# Patient Record
Sex: Female | Born: 2000 | Race: Black or African American | Hispanic: No | Marital: Single | Smoking: Never smoker
Health system: Southern US, Community
[De-identification: ages and names within clinical notes are randomized; demographics above are authoritative.]

## PROBLEM LIST (undated history)

## (undated) ENCOUNTER — Ambulatory Visit: Payer: Self-pay

---
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
## 2022-05-08 ENCOUNTER — Ambulatory Visit: Admit: 2022-05-08 | Payer: Self-pay

## 2022-05-08 ENCOUNTER — Ambulatory Visit
Admission: EM | Admit: 2022-05-08 | Discharge: 2022-05-08 | Disposition: A | Discharge status: Home / Self Care | Payer: Self-pay | Attending: Emergency Medicine | Admitting: Emergency Medicine

## 2022-05-08 DIAGNOSIS — J020 Streptococcal pharyngitis: Secondary | ICD-10-CM

## 2022-05-08 LAB — POCT RAPID STREP A (OFFICE): Rapid Strep A Screen: NEGATIVE

## 2022-05-08 MED ORDER — LIDOCAINE VISCOUS HCL 2 % MT SOLN: 15.0000 mL | OROMUCOSAL | 0 refills | Status: DC | PRN

## 2022-05-08 MED ORDER — CEFDINIR 300 MG PO CAPS: 300.0000 mg | ORAL_CAPSULE | Freq: Two times a day (BID) | ORAL | 0 refills | Status: AC

## 2022-05-08 MED ORDER — IBUPROFEN 400 MG PO TABS: 400.0000 mg | ORAL_TABLET | Freq: Three times a day (TID) | ORAL | 0 refills | Status: DC | PRN

## 2022-05-08 NOTE — ED Triage Notes (Signed)
Pt c/o sore throat, cough and sneeze that developed today. 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   Home interventions: none

## 2022-05-08 NOTE — Discharge Instructions (Signed)
Your strep test today is positive.  I recommend that you begin antibiotics now for treatment.  I have sent a prescription to your pharmacy.  Please take them as prescribed.  You will begin to feel better in about 24 hours.  Please be sure that you you finish the entire 10-day course of treatment to avoid worsening infection that may require longer treatment with stronger antibiotics.   After 24 hours of taking antibiotics, you are no longer contagious.  Please discard your toothbrush as well as any other oral devices that you are currently using and replace them with new ones to avoid reinfection.   Please read below to learn more about the medications, dosages and frequencies that I recommend to help alleviate your symptoms and to get you feeling better soon:  Omnicef (cefdinir):  1 capsule twice daily for 10 days, you can take it with or without food.  This antibiotic can cause upset stomach, this will resolve once antibiotics are complete.  You are welcome to use a probiotic, eat yogurt, take Imodium while taking this medication.  Please avoid other systemic medications such as Maalox, Pepto-Bismol or milk of magnesia as they can interfere with your body's ability to absorb the antibiotics.  Advil, Motrin (ibuprofen): This is a good anti-inflammatory medication which addresses aches, pains and inflammation of the upper airways that causes sinus and nasal congestion as well as in the lower airways which makes your cough feel tight and sometimes burn.  I recommend that you take between 400 to 600 mg every 6-8 hours as needed.      Xylocaine (lidocaine): This is a numbing medication that can be swished for 15 seconds and swallowed.  You can use this every 3 hours while awake to relieve pain in your mouth and throat.  I have sent a prescription for this medication to your pharmacy.   Please follow-up within the next 7-10 days either with your primary care provider or urgent care if your symptoms do not  resolve.  If you do not have a primary care provider, we will assist you in finding one.        Thank you for visiting urgent care today.  We appreciate the opportunity to participate in your care.

## 2022-05-08 NOTE — ED Provider Notes (Signed)
[facility]    CSN: [id number] Arrival date & time: 05/08/22  1807    HISTORY   Chief Complaint  Patient presents with   Cough   Sore Throat   HPI Casey Zimmerman is a pleasant, 21 y.o. female who presents to urgent care today. Patient complains of sore throat, coughing and sneezing that began today.  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  Patient has normal vital signs on arrival today.  Patient states has not tried anything to alleviate her symptoms.  Patient's rapid strep test performed during her visit today was positive.  The history is provided by the patient.   History reviewed. No pertinent past medical history. There are no problems to display for this patient.  History reviewed. No pertinent surgical history. OB History   No obstetric history on file.    Home Medications    Prior to Admission medications   Medication Sig Start Date End Date Taking? Authorizing Provider  cefdinir (OMNICEF) 300 MG capsule Take 1 capsule (300 mg total) by mouth 2 (two) times daily for 10 days. 05/08/22 05/18/22 Yes Theadora Rama Scales, PA-C  ibuprofen (ADVIL) 400 MG tablet Take 1 tablet (400 mg total) by mouth every 8 (eight) hours as needed for up to 30 doses. 05/08/22  Yes Theadora Rama Scales, PA-C  lidocaine (XYLOCAINE) 2 % solution Use as directed 15 mLs in the mouth or throat every 3 (three) hours as needed for mouth pain (Sore throat). 05/08/22  Yes Theadora Rama Scales, PA-C    Family History History reviewed. No pertinent family history. Social History Social History   Tobacco Use   Smoking status: Never   Smokeless tobacco: Never  Vaping Use   Vaping Use: Never used  Substance Use Topics   Alcohol use: Not Currently   Drug use: Never   Allergies   Patient has no allergy information on record.  Review of Systems Review of Systems Pertinent findings revealed after performing a 14 point review of systems has  been noted in the history of present illness.  Physical Exam Triage Vital Signs ED Triage Vitals  Enc Vitals Group     BP 03/30/21 0827 (!) 147/82     Pulse Rate 03/30/21 0827 72     Resp 03/30/21 0827 18     Temp 03/30/21 0827 98.3 F (36.8 C)     Temp Source 03/30/21 0827 Oral     SpO2 03/30/21 0827 98 %     Weight --      Height --      Head Circumference --      Peak Flow --      Pain Score 03/30/21 0826 5     Pain Loc --      Pain Edu? --      Excl. in GC? --   No data found.  Updated Vital Signs BP 109/71 (BP Location: Left Arm)   Pulse 74   Temp 98.5 F (36.9 C) (Oral)   Resp 17   LMP 04/21/2022   SpO2 97%   Physical Exam Constitutional:      General: She is not in acute distress.    Appearance: She is well-developed. She is not ill-appearing or toxic-appearing.  HENT:     Head: Normocephalic and atraumatic.     Salivary Glands: Right salivary gland is diffusely enlarged and tender. Left salivary gland is diffusely enlarged and tender.     Right Ear: Hearing and  external ear normal.     Left Ear: Hearing and external ear normal.     Ears:     Comments: Bilateral EACs with mild erythema, bilateral TMs are normal    Nose: No mucosal edema, congestion or rhinorrhea.     Right Turbinates: Not enlarged, swollen or pale.     Left Turbinates: Not enlarged or swollen.     Right Sinus: No maxillary sinus tenderness or frontal sinus tenderness.     Left Sinus: No maxillary sinus tenderness or frontal sinus tenderness.     Mouth/Throat:     Lips: Pink. No lesions.     Mouth: Mucous membranes are moist. No oral lesions or angioedema.     Dentition: No gingival swelling.     Tongue: No lesions. Tongue does not deviate from midline.     Palate: No mass and lesions.     Pharynx: Uvula midline. Posterior oropharyngeal erythema present. No pharyngeal swelling, oropharyngeal exudate or uvula swelling.     Tonsils: No tonsillar exudate or tonsillar abscesses. 2+ on the  right. 2+ on the left.  Eyes:     Extraocular Movements: Extraocular movements intact.     Conjunctiva/sclera: Conjunctivae normal.     Pupils: Pupils are equal, round, and reactive to light.  Neck:     Thyroid: No thyroid mass, thyromegaly or thyroid tenderness.     Trachea: Tracheal tenderness present. No abnormal tracheal secretions or tracheal deviation.     Comments: Voice is muffled Cardiovascular:     Rate and Rhythm: Normal rate and regular rhythm.     Pulses: Normal pulses.     Heart sounds: Normal heart sounds, S1 normal and S2 normal. No murmur heard.    No friction rub. No gallop.  Pulmonary:     Effort: Pulmonary effort is normal. No accessory muscle usage, prolonged expiration, respiratory distress or retractions.     Breath sounds: No stridor, decreased air movement or transmitted upper airway sounds. No decreased breath sounds, wheezing, rhonchi or rales.  Abdominal:     General: Bowel sounds are normal.     Palpations: Abdomen is soft.     Tenderness: There is generalized abdominal tenderness. There is no right CVA tenderness, left CVA tenderness or rebound. Negative signs include Murphy's sign.     Hernia: No hernia is present.  Musculoskeletal:        General: No tenderness. Normal range of motion.     Cervical back: Full passive range of motion without pain, normal range of motion and neck supple.     Right lower leg: No edema.     Left lower leg: No edema.  Lymphadenopathy:     Cervical: Cervical adenopathy present.     Right cervical: Superficial cervical adenopathy and posterior cervical adenopathy present.     Left cervical: Superficial cervical adenopathy and posterior cervical adenopathy present.  Skin:    General: Skin is warm and dry.     Findings: No erythema, lesion or rash.  Neurological:     General: No focal deficit present.     Mental Status: She is alert and oriented to person, place, and time. Mental status is at baseline.  Psychiatric:         Mood and Affect: Mood normal.        Behavior: Behavior normal.        Thought Content: Thought content normal.        Judgment: Judgment normal.     Visual Acuity Right Eye  Distance:   Left Eye Distance:   Bilateral Distance:    Right Eye Near:   Left Eye Near:    Bilateral Near:     UC Couse / Diagnostics / Procedures:     Radiology No results found.  Procedures Procedures (including critical care time) EKG  Pending results:  Labs Reviewed  POCT RAPID STREP A (OFFICE)    Medications Ordered in UC: Medications - No data to display  UC Diagnoses / Final Clinical Impressions(s)   I have reviewed the triage vital signs and the nursing notes.  Pertinent labs & imaging results that were available during my care of the patient were reviewed by me and considered in my medical decision making (see chart for details).    Final diagnoses:  Acute streptococcal pharyngitis   Rapid strep test today was positive, patient provided with a prescription for cefdinir twice daily for 10 days.  Patient also provided with a prescription for ibuprofen and viscous lidocaine for pain relief.  Return precautions advised.  ED Prescriptions     Medication Sig Dispense Auth. Provider   cefdinir (OMNICEF) 300 MG capsule Take 1 capsule (300 mg total) by mouth 2 (two) times daily for 10 days. 20 capsule Theadora Rama Scales, PA-C   lidocaine (XYLOCAINE) 2 % solution Use as directed 15 mLs in the mouth or throat every 3 (three) hours as needed for mouth pain (Sore throat). 300 mL Theadora Rama Scales, PA-C   ibuprofen (ADVIL) 400 MG tablet Take 1 tablet (400 mg total) by mouth every 8 (eight) hours as needed for up to 30 doses. 30 tablet Theadora Rama Scales, PA-C      PDMP not reviewed this encounter.  Disposition Upon Discharge:  Condition: stable for discharge home Home: take medications as prescribed; routine discharge instructions as discussed; follow up as advised.  Patient  presented with an acute illness with associated systemic symptoms and significant discomfort requiring urgent management. In my opinion, this is a condition that a prudent lay person (someone who possesses an average knowledge of health and medicine) may potentially expect to result in complications if not addressed urgently such as respiratory distress, impairment of bodily function or dysfunction of bodily organs.   Routine symptom specific, illness specific and/or disease specific instructions were discussed with the patient and/or caregiver at length.   As such, the patient has been evaluated and assessed, work-up was performed and treatment was provided in alignment with urgent care protocols and evidence based medicine.  Patient/parent/caregiver has been advised that the patient may require follow up for further testing and treatment if the symptoms continue in spite of treatment, as clinically indicated and appropriate.  If the patient was tested for COVID-19, Influenza and/or RSV, then the patient/parent/guardian was advised to isolate at home pending the results of his/her diagnostic coronavirus test and potentially longer if they're positive. I have also advised pt that if his/her COVID-19 test returns positive, it's recommended to self-isolate for at least 10 days after symptoms first appeared AND until fever-free for 24 hours without fever reducer AND other symptoms have improved or resolved. Discussed self-isolation recommendations as well as instructions for household member/close contacts as per the [facility] and [place] [facility], and also gave patient the COVID packet with this information.  Patient/parent/caregiver has been advised to return to the [facility] or PCP in 3-5 days if no better; to PCP or the Emergency Department if new signs and symptoms develop, or if the current signs or symptoms continue to change  or worsen for further workup, evaluation and treatment as clinically indicated and  appropriate  The patient will follow up with their current PCP if and as advised. If the patient does not currently have a PCP we will assist them in obtaining one.   The patient may need specialty follow up if the symptoms continue, in spite of conservative treatment and management, for further workup, evaluation, consultation and treatment as clinically indicated and appropriate.  Patient/parent/caregiver verbalized understanding and agreement of plan as discussed.  All questions were addressed during visit.  Please see discharge instructions below for further details of plan.  Discharge Instructions:   Discharge Instructions      Your strep test today is positive.  I recommend that you begin antibiotics now for treatment.  I have sent a prescription to your pharmacy.  Please take them as prescribed.  You will begin to feel better in about 24 hours.  Please be sure that you you finish the entire 10-day course of treatment to avoid worsening infection that may require longer treatment with stronger antibiotics.   After 24 hours of taking antibiotics, you are no longer contagious.  Please discard your toothbrush as well as any other oral devices that you are currently using and replace them with new ones to avoid reinfection.   Please read below to learn more about the medications, dosages and frequencies that I recommend to help alleviate your symptoms and to get you feeling better soon:  Omnicef (cefdinir):  1 capsule twice daily for 10 days, you can take it with or without food.  This antibiotic can cause upset stomach, this will resolve once antibiotics are complete.  You are welcome to use a probiotic, eat yogurt, take Imodium while taking this medication.  Please avoid other systemic medications such as Maalox, Pepto-Bismol or milk of magnesia as they can interfere with your body's ability to absorb the antibiotics.  Advil, Motrin (ibuprofen): This is a good anti-inflammatory medication  which addresses aches, pains and inflammation of the upper airways that causes sinus and nasal congestion as well as in the lower airways which makes your cough feel tight and sometimes burn.  I recommend that you take between 400 to 600 mg every 6-8 hours as needed.      Xylocaine (lidocaine): This is a numbing medication that can be swished for 15 seconds and swallowed.  You can use this every 3 hours while awake to relieve pain in your mouth and throat.  I have sent a prescription for this medication to your pharmacy.   Please follow-up within the next 7-10 days either with your primary care provider or urgent care if your symptoms do not resolve.  If you do not have a primary care provider, we will assist you in finding one.        Thank you for visiting urgent care today.  We appreciate the opportunity to participate in your care.       This office note has been dictated using Teaching laboratory technicianDragon speech recognition software.  Unfortunately, this method of dictation can sometimes lead to typographical or grammatical errors.  I apologize for your inconvenience in advance if this occurs.  Please do not hesitate to reach out to me if clarification is needed.      Theadora RamaMorgan, Luisfernando Brightwell Scales, PA-C 05/08/22 2026

## 2023-02-20 ENCOUNTER — Ambulatory Visit (HOSPITAL_COMMUNITY)
Admission: EM | Admit: 2023-02-20 | Discharge: 2023-02-20 | Disposition: A | Discharge status: Home / Self Care | Payer: Self-pay | Attending: Physician Assistant | Admitting: Physician Assistant

## 2023-02-20 ENCOUNTER — Encounter (HOSPITAL_COMMUNITY): Payer: Self-pay

## 2023-02-20 DIAGNOSIS — M62838 Other muscle spasm: Secondary | ICD-10-CM

## 2023-02-20 MED ORDER — CYCLOBENZAPRINE HCL 10 MG PO TABS: 10.0000 mg | ORAL_TABLET | Freq: Two times a day (BID) | ORAL | 0 refills | Status: DC | PRN

## 2023-02-20 MED ORDER — NAPROXEN 500 MG PO TABS: 500.0000 mg | ORAL_TABLET | Freq: Two times a day (BID) | ORAL | 0 refills | Status: DC

## 2023-02-20 NOTE — ED Provider Notes (Signed)
[facility]    CSN: [id number] Arrival date & time: 02/20/23  1624      History   Chief Complaint Chief Complaint  Patient presents with   Shoulder Pain    HPI Casey Zimmerman is a 22 y.o. female.   Patient complains of left shoulder pain that started upon waking yesterday.  Denies injury or trauma.  She feels like she might of slept wrong.  Reports no neck pain.  She reports pain is worse with moving her left arm.  No previous injuries to this arm in the past.  She has taken nothing for the symptoms.    History reviewed. No pertinent past medical history.  There are no problems to display for this patient.   History reviewed. No pertinent surgical history.  OB History   No obstetric history on file.      Home Medications    Prior to Admission medications   Medication Sig Start Date End Date Taking? Authorizing Provider  cyclobenzaprine (FLEXERIL) 10 MG tablet Take 1 tablet (10 mg total) by mouth 2 (two) times daily as needed for muscle spasms. 02/20/23  Yes Ward, Tylene Fantasia, PA-C  naproxen (NAPROSYN) 500 MG tablet Take 1 tablet (500 mg total) by mouth 2 (two) times daily. 02/20/23  Yes Ward, Tylene Fantasia, PA-C  ibuprofen (ADVIL) 400 MG tablet Take 1 tablet (400 mg total) by mouth every 8 (eight) hours as needed for up to 30 doses. 05/08/22   Theadora Rama Scales, PA-C  lidocaine (XYLOCAINE) 2 % solution Use as directed 15 mLs in the mouth or throat every 3 (three) hours as needed for mouth pain (Sore throat). 05/08/22   Theadora Rama Scales, PA-C    Family History History reviewed. No pertinent family history.  Social History Social History   Tobacco Use   Smoking status: Never   Smokeless tobacco: Never  Vaping Use   Vaping status: Never Used  Substance Use Topics   Alcohol use: Not Currently   Drug use: Never     Allergies   Patient has no known allergies.   Review of Systems Review of Systems  Constitutional:  Negative for chills  and fever.  HENT:  Negative for ear pain and sore throat.   Eyes:  Negative for pain and visual disturbance.  Respiratory:  Negative for cough and shortness of breath.   Cardiovascular:  Negative for chest pain and palpitations.  Gastrointestinal:  Negative for abdominal pain and vomiting.  Genitourinary:  Negative for dysuria and hematuria.  Musculoskeletal:  Positive for arthralgias. Negative for back pain.  Skin:  Negative for color change and rash.  Neurological:  Negative for seizures and syncope.  All other systems reviewed and are negative.    Physical Exam Triage Vital Signs ED Triage Vitals  Encounter Vitals Group     BP 02/20/23 1714 116/72     Systolic BP Percentile --      Diastolic BP Percentile --      Pulse Rate 02/20/23 1714 69     Resp 02/20/23 1714 16     Temp 02/20/23 1714 98.3 F (36.8 C)     Temp Source 02/20/23 1714 Oral     SpO2 02/20/23 1714 98 %     Weight 02/20/23 1714 132 lb (59.9 kg)     Height 02/20/23 1714 5\' 8"  (1.727 m)     Head Circumference --      Peak Flow --      Pain Score 02/20/23 1712  8     Pain Loc --      Pain Education --      Exclude from Growth Chart --    No data found.  Updated Vital Signs BP 116/72 (BP Location: Right Arm)   Pulse 69   Temp 98.3 F (36.8 C) (Oral)   Resp 16   Ht 5\' 8"  (1.727 m)   Wt 132 lb (59.9 kg)   LMP 01/31/2023 (Approximate)   SpO2 98%   BMI 20.07 kg/m   Visual Acuity Right Eye Distance:   Left Eye Distance:   Bilateral Distance:    Right Eye Near:   Left Eye Near:    Bilateral Near:     Physical Exam Vitals and nursing note reviewed.  Constitutional:      General: She is not in acute distress.    Appearance: She is well-developed.  HENT:     Head: Normocephalic and atraumatic.  Eyes:     Conjunctiva/sclera: Conjunctivae normal.  Cardiovascular:     Rate and Rhythm: Normal rate and regular rhythm.     Heart sounds: No murmur heard. Pulmonary:     Effort: Pulmonary effort is  normal. No respiratory distress.     Breath sounds: Normal breath sounds.  Abdominal:     Palpations: Abdomen is soft.     Tenderness: There is no abdominal tenderness.  Musculoskeletal:        General: No swelling.     Cervical back: Neck supple.     Comments: Left trapezius spasm and TTP.  Normal shoulder range of motion.  Normal strength.  Normal cervical range of motion.  Skin:    General: Skin is warm and dry.     Capillary Refill: Capillary refill takes less than 2 seconds.  Neurological:     Mental Status: She is alert.  Psychiatric:        Mood and Affect: Mood normal.      UC Treatments / Results  Labs (all labs ordered are listed, but only abnormal results are displayed) Labs Reviewed - No data to display  EKG   Radiology No results found.  Procedures Procedures (including critical care time)  Medications Ordered in UC Medications - No data to display  Initial Impression / Assessment and Plan / UC Course  I have reviewed the triage vital signs and the nursing notes.  Pertinent labs & imaging results that were available during my care of the patient were reviewed by me and considered in my medical decision making (see chart for details).     No injury or trauma, muscle spasm noted on exam.  Will send in muscle relaxer and anti-inflammatory.  Supportive care discussed.  Return precautions discussed. Final Clinical Impressions(s) / UC Diagnoses   Final diagnoses:  Muscle spasms of neck     Discharge Instructions      Take Flexeril as needed for muscle spasm. Can take naproxen as needed for pain. Recommend gentle stretching and ice or heat to affected area.     ED Prescriptions     Medication Sig Dispense Auth. Provider   cyclobenzaprine (FLEXERIL) 10 MG tablet Take 1 tablet (10 mg total) by mouth 2 (two) times daily as needed for muscle spasms. 20 tablet Ward, Shanda Bumps Z, PA-C   naproxen (NAPROSYN) 500 MG tablet Take 1 tablet (500 mg total) by  mouth 2 (two) times daily. 30 tablet Ward, Tylene Fantasia, PA-C      PDMP not reviewed this encounter.   Ward, Shanda Bumps  Z, PA-C 02/20/23 1744

## 2023-02-20 NOTE — Discharge Instructions (Signed)
Take Flexeril as needed for muscle spasm. Can take naproxen as needed for pain. Recommend gentle stretching and ice or heat to affected area.

## 2023-02-20 NOTE — ED Triage Notes (Signed)
Patient woke up with pain in the left shoulder yesterday. Patient thinks she slept wrong. When taking off her back pack today the pain returned and increased. No previous injures.   Patient has not taken any pain meds today.

## 2024-05-24 ENCOUNTER — Ambulatory Visit
Admission: EM | Admit: 2024-05-24 | Discharge: 2024-05-24 | Disposition: A | Discharge status: Home / Self Care | Payer: Self-pay | Attending: Family Medicine | Admitting: Family Medicine

## 2024-05-24 DIAGNOSIS — J101 Influenza due to other identified influenza virus with other respiratory manifestations: Secondary | ICD-10-CM

## 2024-05-24 LAB — POC SOFIA SARS ANTIGEN FIA: SARS Coronavirus 2 Ag: NEGATIVE

## 2024-05-24 LAB — POCT INFLUENZA A/B
Influenza A, POC: POSITIVE — AB
Influenza B, POC: NEGATIVE

## 2024-05-24 MED ORDER — OSELTAMIVIR PHOSPHATE 75 MG PO CAPS: 75.0000 mg | ORAL_CAPSULE | Freq: Two times a day (BID) | ORAL | 0 refills | Status: AC

## 2024-05-24 NOTE — ED Provider Notes (Signed)
 " [facility]    CSN: [id number] Arrival date & time: 05/24/24  1136      History   Chief Complaint Chief Complaint  Patient presents with   Sore Throat   Cough   Generalized Body Aches    HPI Casey Zimmerman is a 23 y.o. female.   HPI  Patient has a sore throat and cough, fever and chills, headache and body ache for 2 days  History reviewed. No pertinent past medical history.  There are no active problems to display for this patient.   History reviewed. No pertinent surgical history.  OB History   No obstetric history on file.      Home Medications    Prior to Admission medications  Medication Sig Start Date End Date Taking? Authorizing Provider  oseltamivir  (TAMIFLU ) 75 MG capsule Take 1 capsule (75 mg total) by mouth every 12 (twelve) hours. 05/24/24  Yes Maranda Jamee Jacob, MD    Family History History reviewed. No pertinent family history.  Social History Social History[1]   Allergies   Patient has no known allergies.   Review of Systems Review of Systems  See HPI Physical Exam Triage Vital Signs ED Triage Vitals [05/24/24 1148]  Encounter Vitals Group     BP 138/82     Girls Systolic BP Percentile      Girls Diastolic BP Percentile      Boys Systolic BP Percentile      Boys Diastolic BP Percentile      Pulse Rate (!) 123     Resp 18     Temp 99.9 F (37.7 C)     Temp Source Oral     SpO2 98 %     Weight      Height      Head Circumference      Peak Flow      Pain Score 0     Pain Loc      Pain Education      Exclude from Growth Chart    No data found.  Updated Vital Signs BP 138/82 (BP Location: Right Arm)   Pulse (!) 123   Temp 99.9 F (37.7 C) (Oral)   Resp 18   LMP  (LMP Unknown)   SpO2 98%       Physical Exam Constitutional:      General: She is not in acute distress.    Appearance: She is well-developed. She is ill-appearing.  HENT:     Head: Normocephalic and atraumatic.  Eyes:      Conjunctiva/sclera: Conjunctivae normal.     Pupils: Pupils are equal, round, and reactive to light.  Cardiovascular:     Rate and Rhythm: Normal rate and regular rhythm.  Pulmonary:     Effort: Pulmonary effort is normal. No respiratory distress.     Breath sounds: Normal breath sounds.  Musculoskeletal:        General: Normal range of motion.     Cervical back: Normal range of motion.  Skin:    General: Skin is warm and dry.  Neurological:     Mental Status: She is alert.      UC Treatments / Results  Labs (all labs ordered are listed, but only abnormal results are displayed) Labs Reviewed  POCT INFLUENZA A/B - Abnormal; Notable for the following components:      Result Value   Influenza A, POC Positive (*)    All other components within normal limits  POC SOFIA SARS  ANTIGEN FIA    EKG   Radiology No results found.  Procedures Procedures (including critical care time)  Medications Ordered in UC Medications - No data to display  Initial Impression / Assessment and Plan / UC Course  I have reviewed the triage vital signs and the nursing notes.  Pertinent labs & imaging results that were available during my care of the patient were reviewed by me and considered in my medical decision making (see chart for details).     Final Clinical Impressions(s) / UC Diagnoses   Final diagnoses:  Influenza A     Discharge Instructions      Take the Tamiflu  2 times a day for 5 days Drink lots of water Take Tylenol, ibuprofen  as needed for pain and fever Call for problems   ED Prescriptions     Medication Sig Dispense Auth. Provider   oseltamivir  (TAMIFLU ) 75 MG capsule Take 1 capsule (75 mg total) by mouth every 12 (twelve) hours. 10 capsule Maranda Jamee Jacob, MD      PDMP not reviewed this encounter.    [1]  Social History Tobacco Use   Smoking status: Never   Smokeless tobacco: Never  Vaping Use   Vaping status: Never Used  Substance Use Topics    Alcohol use: Not Currently   Drug use: Never     Maranda Jamee Jacob, MD 05/24/24 1712  "

## 2024-05-24 NOTE — Discharge Instructions (Signed)
 Take the Tamiflu  2 times a day for 5 days Drink lots of water Take Tylenol, ibuprofen  as needed for pain and fever Call for problems

## 2024-05-24 NOTE — ED Triage Notes (Signed)
 Pt c/o cough, sore throat and bodyaches x 2 days. Taking nyquil prn.
